# Patient Record
Sex: Female | Born: 1990 | Race: White | Hispanic: No
Health system: Southern US, Community
[De-identification: ages and names within clinical notes are randomized; demographics above are authoritative.]

---

## 2013-11-30 ENCOUNTER — Other Ambulatory Visit: Payer: Self-pay

## 2013-12-07 ENCOUNTER — Other Ambulatory Visit (HOSPITAL_COMMUNITY): Payer: Self-pay | Admitting: Nurse Practitioner

## 2013-12-07 DIAGNOSIS — R772 Abnormality of alphafetoprotein: Secondary | ICD-10-CM

## 2013-12-07 DIAGNOSIS — Z3689 Encounter for other specified antenatal screening: Secondary | ICD-10-CM

## 2013-12-11 ENCOUNTER — Ambulatory Visit (HOSPITAL_COMMUNITY)
Admission: RE | Admit: 2013-12-11 | Discharge: 2013-12-11 | Disposition: A | Discharge status: Home / Self Care | Payer: Medicaid - Out of State | Source: Ambulatory Visit | Attending: Nurse Practitioner | Admitting: Nurse Practitioner

## 2013-12-11 ENCOUNTER — Encounter (HOSPITAL_COMMUNITY): Payer: Self-pay

## 2013-12-11 DIAGNOSIS — R772 Abnormality of alphafetoprotein: Secondary | ICD-10-CM

## 2013-12-11 DIAGNOSIS — O358XX0 Maternal care for other (suspected) fetal abnormality and damage, not applicable or unspecified: Secondary | ICD-10-CM | POA: Insufficient documentation

## 2013-12-11 DIAGNOSIS — Z3689 Encounter for other specified antenatal screening: Secondary | ICD-10-CM | POA: Insufficient documentation

## 2013-12-11 DIAGNOSIS — IMO0002 Reserved for concepts with insufficient information to code with codable children: Secondary | ICD-10-CM | POA: Insufficient documentation

## 2013-12-11 NOTE — Progress Notes (Signed)
Genetic Counseling  High-Risk Gestation Note  Appointment Date:  12/11/2013 Referred By: Orbie Hurst, NP Date of Birth:  07-21-1990    Pregnancy History: G3P2 Estimated Date of Delivery: 05/02/14 Estimated Gestational Age: [redacted]w[redacted]d Attending: Particia Nearing, MD   [name] and her partner, Mr. Anna Harrington, were seen for consultation for genetic counseling because of an elevated MSAFP of 2.54 MoMs based on maternal serum screening through [facility].    We reviewed Anna Harrington's maternal serum screening result, the elevation of MSAFP, and the associated 1 in 267 risk for a fetal open neural tube defect.   We reviewed ONTDs, the typical multifactorial etiology, and variable prognosis.  In addition, we discussed additional explanations for an elevated MSAFP including: normal variation, twins, feto-maternal bleeding, a gestational dating error, abdominal wall defects, kidney differences, oligohydramnios, and placental problems.  We discussed that an unexplained elevation of MSAFP is associated with an increased risk for third trimester complications including: prematurity, low birth weight, and pre-eclampsia.  We discussed that upon close examination of the AFP result report, the incorrect gestational age was entered into the calculations. MSAFP was recalculated using the [facility] of 05/02/14, meaning that the patient was [redacted]w[redacted]d at the time the MSAFP was performed. The recalculated result is screen negative for open spina bifida, with 2.21 MoM and 1 in 620 open spina bifida risk.   We reviewed additional available screening and diagnostic options including detailed ultrasound and amniocentesis.  We discussed the risks, limitations, and benefits of each.  After thoughtful consideration of these options, Anna Harrington elected to have ultrasound, but declined amniocentesis.  She understands that ultrasound cannot rule out all birth defects or genetic syndromes.  However, she was counseled that ~90% of  fetuses with ONTDs can be detected by detailed 2nd trimester ultrasound, when well visualized.  A complete ultrasound was performed today.  The ultrasound report will be sent under separate cover.  [name] was provided with written information regarding cystic fibrosis (CF) including the carrier frequency and incidence in the Caucasian population, the availability of carrier testing and prenatal diagnosis if indicated.  In addition, we discussed that CF is routinely screened for as part of the [place] newborn screening panel.  She declined testing today.   Both family histories were reviewed and found to be contributory for cerebral palsy and autism in the couple's 60 year-old son. The patient reported that her son was born at [redacted] weeks gestation and had decreased fetal movement at the end of the pregnancy. She reported that his left side is primarily affected by cerebral palsy. He was evaluated and diagnosed with autism by Anna Harrington at [facility] in [place], [place]. The patient reported that he possibly has features of Asperger syndrome but is too young to receive this diagnosis at this time. He currently receives physical therapy, speech therapy, and occupational therapy. She reported that no genetic testing has been performed, and the etiology for his autism is not known. Additionally, she reported that her maternal half-nephew (her maternal half-sister's son) has autism (etiology unknown), and a female paternal first cousin once removed has autism and seizures. The etiology for her features is also unknown.   We discussed that cerebral palsy is not expected to have a genetic component, and a family history of cerebral palsy is not expected to increased recurrence risk for relatives, in the case that the diagnosis is correct. We discussed that autism is part of the spectrum of conditions referred  to as Autistic spectrum disorders (ASD). We discussed that ASDs  are among the most common neurodevelopmental disorders, with approximately 1 in 88 children meeting criteria for ASD. Approximately 80% of individuals diagnosed are female. There is strong evidence that genetic factors play a critical role in development of ASD. There have been recent advances in identifying specific genetic causes of ASD, however, there are still many individuals for whom the etiology of the ASD is not known. Once a family has a child with a diagnosis of ASD, there is a 13.5% chance to have another child with ASD. If the pregnancy is female the chance is approximately 9%, and approximately 26% if the pregnancy is female. When there is more than one affected sibling, the recurrence chance is 32%. Therefore, based on what we know about this family, there should be an approximate 13.5% recurrence chance for this pregnancy. They understand that at this time there is not genetic testing available for ASD for most families, and that available prenatal screening and testing would not assess for autism in the absence of an identified genetic etiology. Without further information regarding the provided family history, an accurate genetic risk cannot be calculated. Further genetic counseling is warranted if more information is obtained.  [name] denied exposure to environmental toxins or chemical agents. She denied the use of alcohol, tobacco or street drugs. She denied significant viral illnesses during the course of her pregnancy. Her medical and surgical histories were contributory for history of heart murmur. She reported that she sees a cardiologist but does has not required treatment for this history.   I counseled this couple for approximately 35 minutes regarding the above risks and available options.    Anna AppKaren Louise Jeni SallesEch Lundyn Coste, MS,  Certified Genetic Counselor 12/11/2013

## 2014-05-13 ENCOUNTER — Encounter (HOSPITAL_COMMUNITY): Payer: Self-pay

## 2014-10-16 ENCOUNTER — Encounter (HOSPITAL_COMMUNITY): Payer: Self-pay | Admitting: *Deleted

## 2015-07-08 IMAGING — US US OB DETAIL+14 WK
1 series · 12 of 28 positions shown · non-contrast
Comparison: none

[Series 1: us ob detail+14 wk · 0.15mm/px · 12 of 94 slices shown]
[im 4/94]
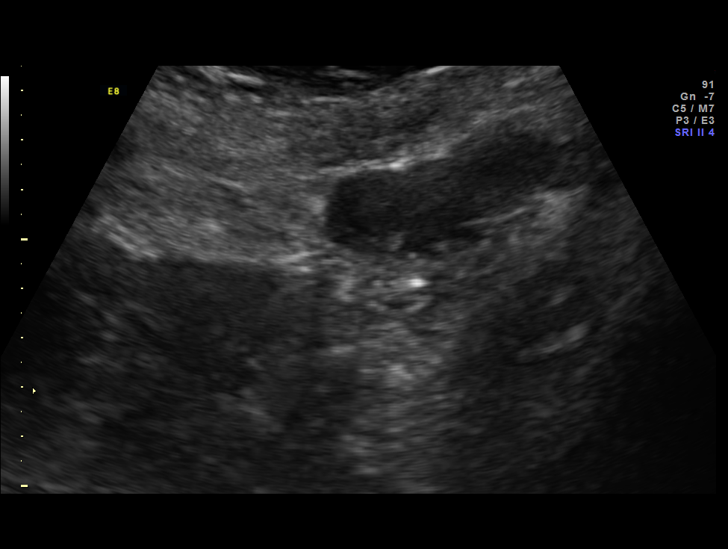
[im 11/94]
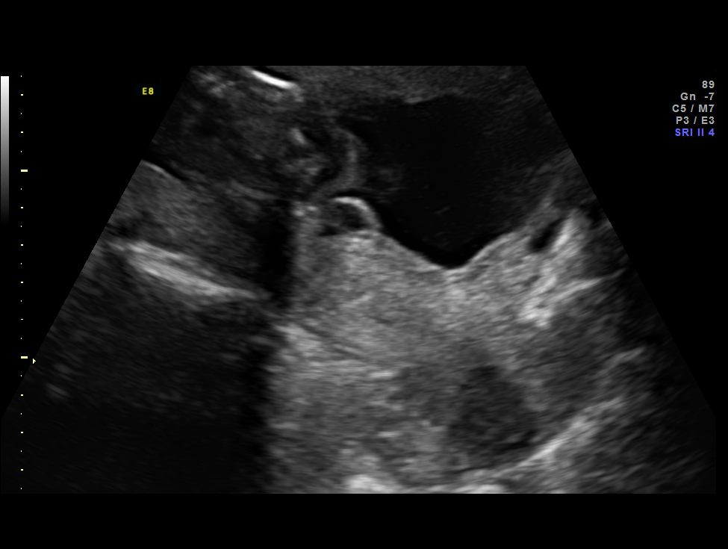
[im 18/94]
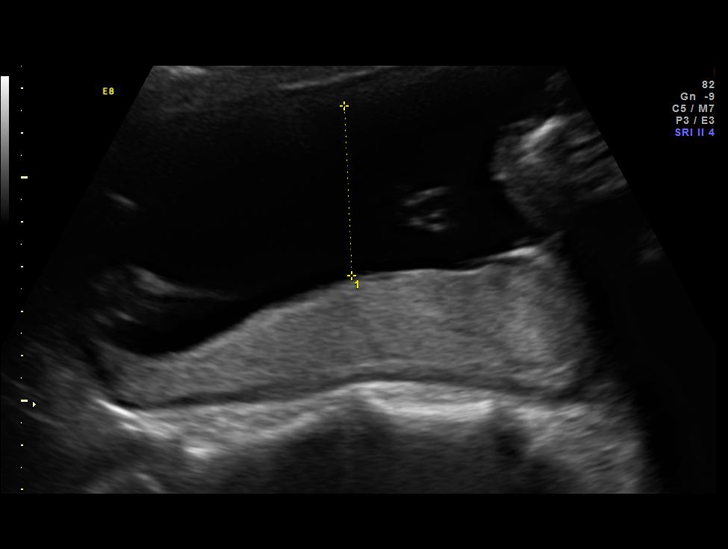
[im 28/94]
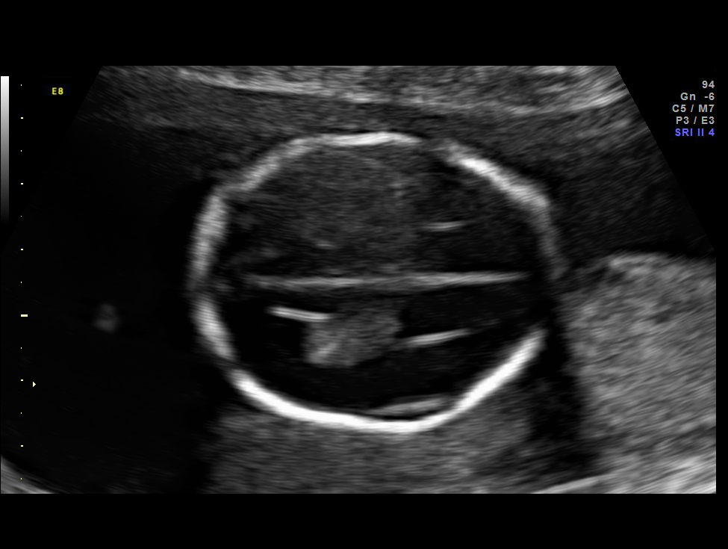
[im 35/94]
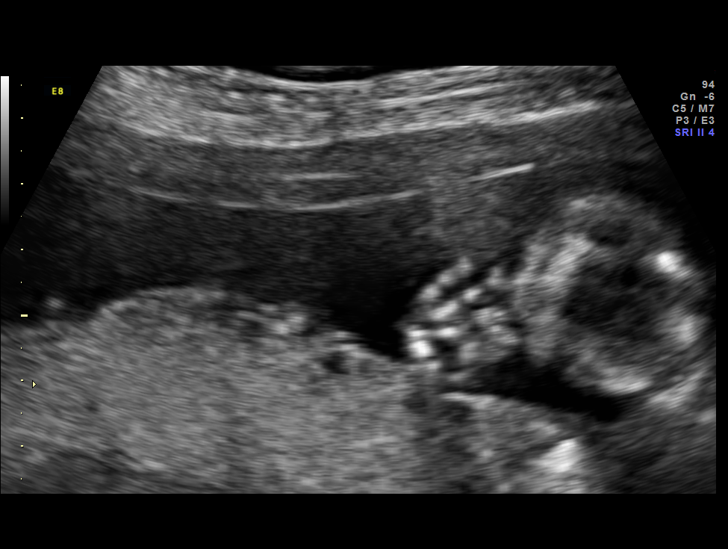
[im 42/94]
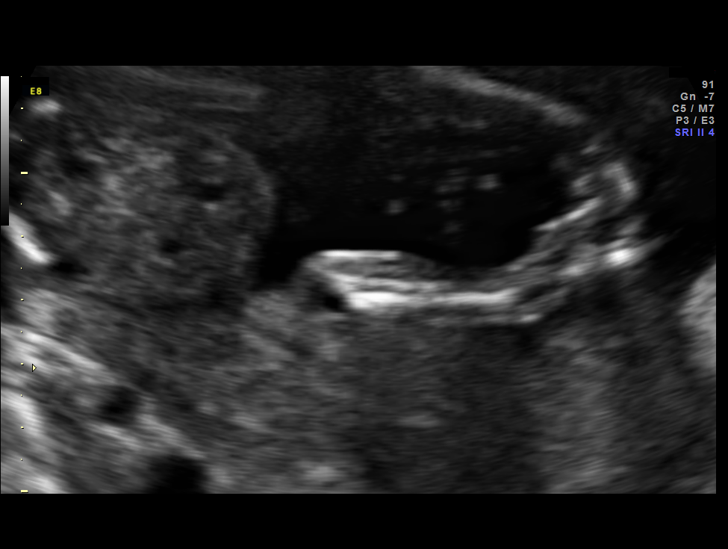
[im 52/94]
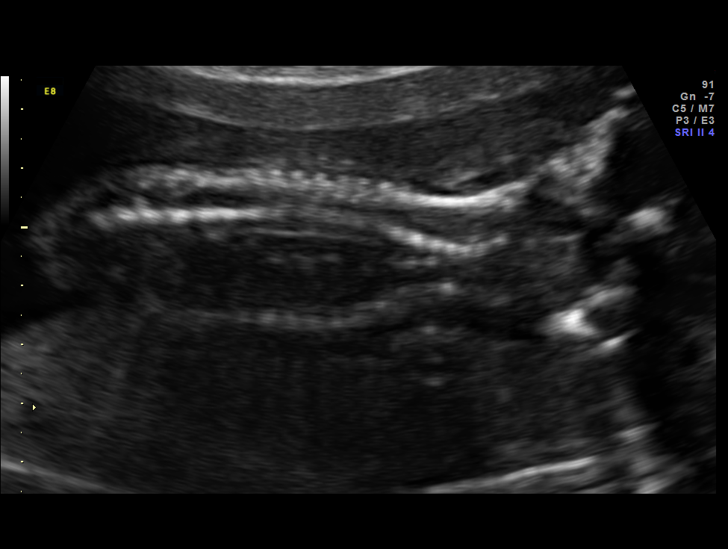
[im 59/94]
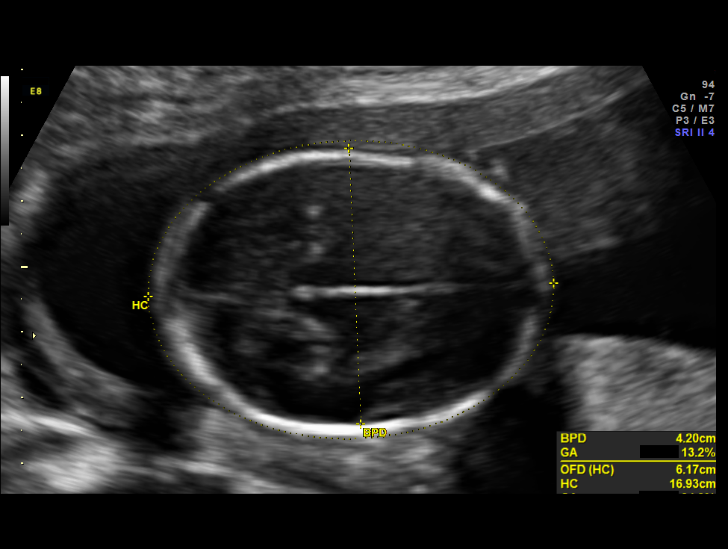
[im 66/94]
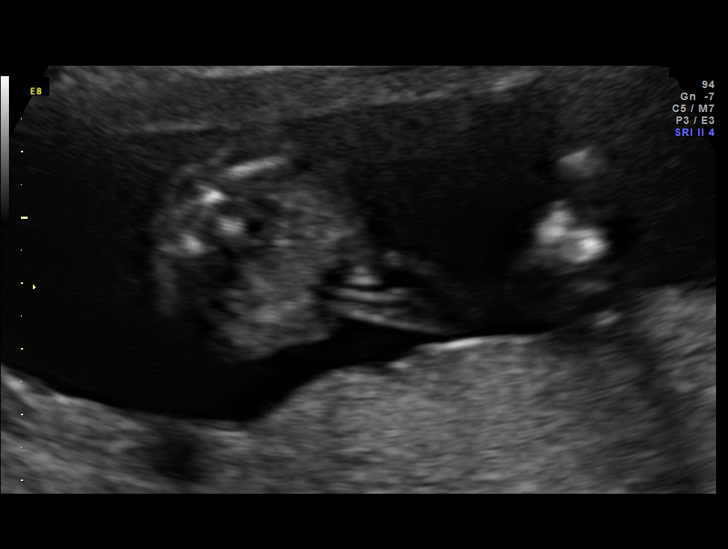
[im 76/94]
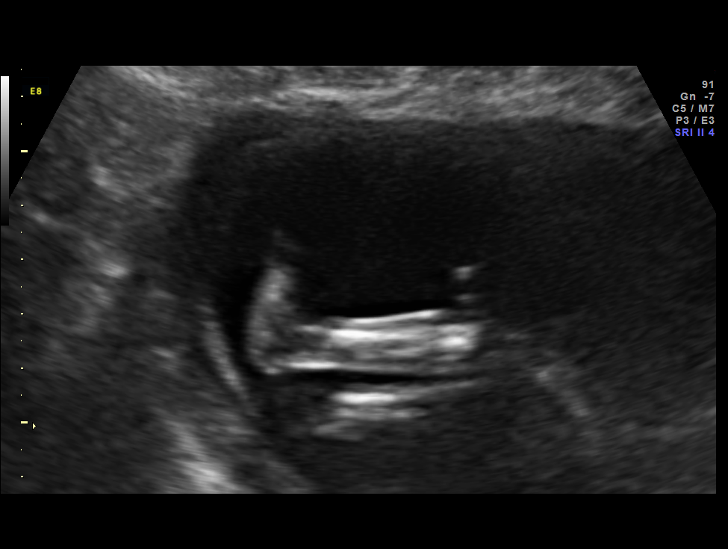
[im 83/94]
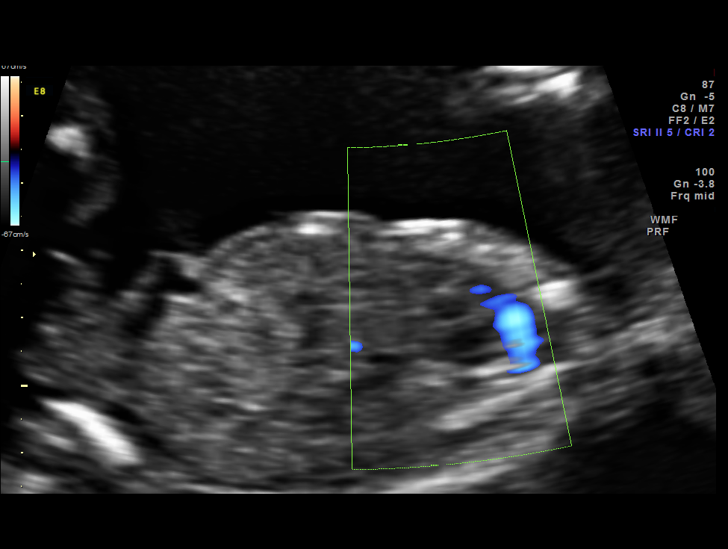
[im 90/94]
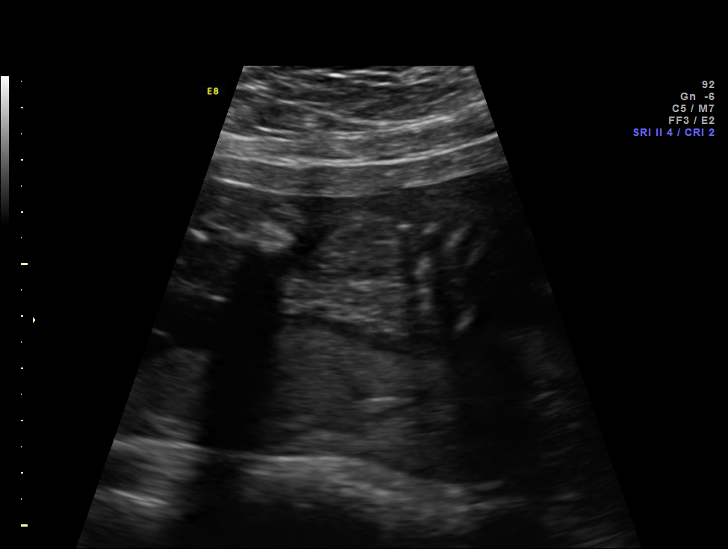

[12 of 28 positions shown; findings below may reference images not displayed]

OBSTETRICS REPORT
                      (Signed Final 12/11/2013 [DATE])

Service(s) Provided

 US OB DETAIL + 14 WK                                  76811.0
Indications

 Detailed fetal anatomic survey
 Elevated MSAFP
 History of genetic / anatomic abnormality -
 personal or family (CP, autism)
Fetal Evaluation

 Num Of Fetuses:    1
 Fetal Heart Rate:  145                          bpm
 Cardiac Activity:  Observed
 Presentation:      Cephalic
 Placenta:          Posterior, above cervical
                    os
 P. Cord            Visualized
 Insertion:

 Amniotic Fluid
 AFI FV:      Subjectively within normal limits
                                             Larg Pckt:     3.8  cm
Biometry

 BPD:     41.7  mm     G. Age:  18w 4d                CI:         66.9   70 - 86
 OFD:     62.3  mm                                    FL/HC:      18.5   16.8 -

 HC:     169.3  mm     G. Age:  19w 4d       36  %    HC/AC:      1.15   1.09 -

 AC:     147.3  mm     G. Age:  20w 0d       55  %    FL/BPD:
 FL:      31.4  mm     G. Age:  19w 5d       44  %    FL/AC:      21.3   20 - 24
 HUM:     29.1  mm     G. Age:  19w 3d       46  %
 CER:     20.6  mm     G. Age:  19w 4d       48  %
 NFT:        3  mm

 Est. FW:     315  gm    0 lb 11 oz      50  %
Gestational Age

 U/S Today:     19w 3d                                        EDD:   05/04/14
 Best:          19w 5d     Det. By:  Early Ultrasound         EDD:   05/02/14
Anatomy

 Cranium:          Appears normal         Aortic Arch:      Appears normal
 Fetal Cavum:      Appears normal         Ductal Arch:      Appears normal
 Ventricles:       Appears normal         Stomach:          Appears normal, left
                                                            sided
 Choroid Plexus:   Appears normal         Abdomen:          Appears normal
 Cerebellum:       Appears normal         Abdominal Wall:   Appears nml (cord
                                                            insert, abd wall)
 Posterior Fossa:  Appears normal         Cord Vessels:     Appears normal (3
                                                            vessel cord)
 Nuchal Fold:      Appears normal         Kidneys:          Appear normal
 Face:             Appears normal         Bladder:          Appears normal
                   (orbits and profile)
 Lips:             Appears normal         Spine:            Appears normal
 Heart:            Appears normal         Lower             Appears normal
                   (4CH, axis, and        Extremities:
                   situs)
 RVOT:             Appears normal         Upper             Appears normal
                                          Extremities:
 LVOT:             Appears normal

 Other:  Fetus appears to be a male. Heels and 5th digit appear normal.
Targeted Anatomy

 Fetal Central Nervous System
 Cisterna Magna:
Cervix Uterus Adnexa

 Cervical Length:    3.1      cm

 Cervix:       Normal appearance by transabdominal scan. Appears
               closed, without funnelling.

 Left Ovary:    Within normal limits.
 Right Ovary:   Within normal limits.
Impression

 SIUP at 19+5 weeks
 Normal detailed fetal anatomy
 Markers of aneuploidy: none
 Normal amniotic fluid volume
 Measurements consistent with early US

 The revised MSAFP report was interpreted as screen
 negative (MoM 2.12; 1 in 620).
Recommendations

 Follow-up as clinically indicated
 Please see genetic counseling letter
